# Patient Record
Sex: Male | Born: 1995 | Race: White | Hispanic: No | Marital: Single | Smoking: Never smoker
Health system: Southern US, Community
[De-identification: ages and names within clinical notes are randomized; demographics above are authoritative.]

---

## 2019-04-11 ENCOUNTER — Other Ambulatory Visit: Payer: Self-pay

## 2019-04-11 ENCOUNTER — Emergency Department: Payer: 59

## 2019-04-11 ENCOUNTER — Emergency Department
Admission: EM | Admit: 2019-04-11 | Discharge: 2019-04-12 | Disposition: A | Discharge status: Home / Self Care | Payer: 59 | Attending: Emergency Medicine | Admitting: Emergency Medicine

## 2019-04-11 ENCOUNTER — Encounter: Payer: Self-pay | Admitting: *Deleted

## 2019-04-11 DIAGNOSIS — F17220 Nicotine dependence, chewing tobacco, uncomplicated: Secondary | ICD-10-CM | POA: Insufficient documentation

## 2019-04-11 DIAGNOSIS — Y999 Unspecified external cause status: Secondary | ICD-10-CM | POA: Insufficient documentation

## 2019-04-11 DIAGNOSIS — Y9389 Activity, other specified: Secondary | ICD-10-CM | POA: Insufficient documentation

## 2019-04-11 DIAGNOSIS — S0083XA Contusion of other part of head, initial encounter: Secondary | ICD-10-CM | POA: Insufficient documentation

## 2019-04-11 DIAGNOSIS — Y92410 Unspecified street and highway as the place of occurrence of the external cause: Secondary | ICD-10-CM | POA: Insufficient documentation

## 2019-04-11 DIAGNOSIS — Z041 Encounter for examination and observation following transport accident: Secondary | ICD-10-CM | POA: Diagnosis present

## 2019-04-11 LAB — URINALYSIS, COMPLETE (UACMP) WITH MICROSCOPIC
Bacteria, UA: NONE SEEN
Bilirubin Urine: NEGATIVE
Glucose, UA: NEGATIVE mg/dL
Ketones, ur: NEGATIVE mg/dL
Leukocytes,Ua: NEGATIVE
Nitrite: NEGATIVE
Protein, ur: 100 mg/dL — AB
Specific Gravity, Urine: 1.014 (ref 1.005–1.030)
Squamous Epithelial / HPF: NONE SEEN (ref 0–5)
pH: 7 (ref 5.0–8.0)

## 2019-04-11 LAB — CBC
HCT: 47.9 % (ref 39.0–52.0)
Hemoglobin: 17.1 g/dL — ABNORMAL HIGH (ref 13.0–17.0)
MCH: 32.6 pg (ref 26.0–34.0)
MCHC: 35.7 g/dL (ref 30.0–36.0)
MCV: 91.4 fL (ref 80.0–100.0)
Platelets: 314 10*3/uL (ref 150–400)
RBC: 5.24 MIL/uL (ref 4.22–5.81)
RDW: 12.1 % (ref 11.5–15.5)
WBC: 10.4 10*3/uL (ref 4.0–10.5)
nRBC: 0 % (ref 0.0–0.2)

## 2019-04-11 LAB — URINE DRUG SCREEN, QUALITATIVE (ARMC ONLY)
Amphetamines, Ur Screen: NOT DETECTED
Barbiturates, Ur Screen: NOT DETECTED
Benzodiazepine, Ur Scrn: NOT DETECTED
Cannabinoid 50 Ng, Ur ~~LOC~~: POSITIVE — AB
Cocaine Metabolite,Ur ~~LOC~~: NOT DETECTED
MDMA (Ecstasy)Ur Screen: NOT DETECTED
Methadone Scn, Ur: NOT DETECTED
Opiate, Ur Screen: NOT DETECTED
Phencyclidine (PCP) Ur S: NOT DETECTED
Tricyclic, Ur Screen: NOT DETECTED

## 2019-04-11 LAB — COMPREHENSIVE METABOLIC PANEL
ALT: 30 U/L (ref 0–44)
AST: 41 U/L (ref 15–41)
Albumin: 5 g/dL (ref 3.5–5.0)
Alkaline Phosphatase: 79 U/L (ref 38–126)
Anion gap: 11 (ref 5–15)
BUN: 13 mg/dL (ref 6–20)
CO2: 25 mmol/L (ref 22–32)
Calcium: 9 mg/dL (ref 8.9–10.3)
Chloride: 106 mmol/L (ref 98–111)
Creatinine, Ser: 0.91 mg/dL (ref 0.61–1.24)
GFR calc Af Amer: >60 mL/min (ref >60)
GFR calc non Af Amer: >60 mL/min (ref >60)
Glucose, Bld: 102 mg/dL — ABNORMAL HIGH (ref 70–99)
Potassium: 3.7 mmol/L (ref 3.5–5.1)
Sodium: 142 mmol/L (ref 135–145)
Total Bilirubin: 0.8 mg/dL (ref 0.3–1.2)
Total Protein: 8.1 g/dL (ref 6.5–8.1)

## 2019-04-11 LAB — ETHANOL: Alcohol, Ethyl (B): 214 mg/dL — ABNORMAL HIGH (ref <10)

## 2019-04-11 MED ORDER — SODIUM CHLORIDE 0.9 % IV BOLUS
1000.0000 mL | Freq: Once | INTRAVENOUS | Status: AC
  Administered 2019-04-11: 23:00:00 1000 mL via INTRAVENOUS

## 2019-04-11 MED ORDER — IOHEXOL 300 MG/ML  SOLN
100.0000 mL | Freq: Once | INTRAMUSCULAR | Status: AC | PRN
  Administered 2019-04-11: 22:00:00 100 mL via INTRAVENOUS
  Filled 2019-04-11: qty 100
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
## 2019-04-11 NOTE — ED Notes (Signed)
Patient transported to CT 

## 2019-04-12 NOTE — ED Notes (Signed)
ED Provider at bedside. 
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
